# Patient Record
Sex: Female | Born: 1985 | Race: White | Hispanic: No | Marital: Single | Smoking: Former smoker
Health system: Southern US, Community
[De-identification: ages and names within clinical notes are randomized; demographics above are authoritative.]

## PROBLEM LIST (undated history)

## (undated) DIAGNOSIS — Z22322 Carrier or suspected carrier of Methicillin resistant Staphylococcus aureus: Secondary | ICD-10-CM

---

## 2017-09-23 ENCOUNTER — Encounter: Payer: Self-pay | Admitting: Emergency Medicine

## 2017-09-23 ENCOUNTER — Emergency Department: Payer: Self-pay

## 2017-09-23 ENCOUNTER — Other Ambulatory Visit: Payer: Self-pay

## 2017-09-23 ENCOUNTER — Emergency Department
Admission: EM | Admit: 2017-09-23 | Discharge: 2017-09-23 | Disposition: A | Discharge status: Home / Self Care | Payer: Self-pay | Attending: Emergency Medicine | Admitting: Emergency Medicine

## 2017-09-23 DIAGNOSIS — Z87891 Personal history of nicotine dependence: Secondary | ICD-10-CM | POA: Insufficient documentation

## 2017-09-23 DIAGNOSIS — J9801 Acute bronchospasm: Secondary | ICD-10-CM | POA: Insufficient documentation

## 2017-09-23 DIAGNOSIS — R0602 Shortness of breath: Secondary | ICD-10-CM | POA: Insufficient documentation

## 2017-09-23 HISTORY — DX: Carrier or suspected carrier of methicillin resistant Staphylococcus aureus: Z22.322

## 2017-09-23 LAB — TROPONIN I
Troponin I: <0.03 ng/mL (ref <0.03)
Troponin I: <0.03 ng/mL (ref <0.03)

## 2017-09-23 LAB — BASIC METABOLIC PANEL
Anion gap: 8 (ref 5–15)
BUN: 11 mg/dL (ref 6–20)
CO2: 21 mmol/L — AB (ref 22–32)
CREATININE: 0.6 mg/dL (ref 0.44–1.00)
Calcium: 8.7 mg/dL — ABNORMAL LOW (ref 8.9–10.3)
Chloride: 107 mmol/L (ref 101–111)
GFR calc Af Amer: >60 mL/min (ref >60)
GFR calc non Af Amer: >60 mL/min (ref >60)
Glucose, Bld: 139 mg/dL — ABNORMAL HIGH (ref 65–99)
Potassium: 4 mmol/L (ref 3.5–5.1)
Sodium: 136 mmol/L (ref 135–145)

## 2017-09-23 LAB — CBC
HCT: 40.1 % (ref 35.0–47.0)
Hemoglobin: 14 g/dL (ref 12.0–16.0)
MCH: 30.5 pg (ref 26.0–34.0)
MCHC: 34.8 g/dL (ref 32.0–36.0)
MCV: 87.5 fL (ref 80.0–100.0)
PLATELETS: 283 10*3/uL (ref 150–440)
RBC: 4.58 MIL/uL (ref 3.80–5.20)
RDW: 12.8 % (ref 11.5–14.5)
WBC: 7.9 10*3/uL (ref 3.6–11.0)

## 2017-09-23 LAB — POCT PREGNANCY, URINE: PREG TEST UR: NEGATIVE

## 2017-09-23 LAB — FIBRIN DERIVATIVES D-DIMER (ARMC ONLY): Fibrin derivatives D-dimer (ARMC): 160.41 ng/mL (FEU) (ref 0.00–499.00)

## 2017-09-23 MED ORDER — ALBUTEROL SULFATE (2.5 MG/3ML) 0.083% IN NEBU
5.0000 mg | INHALATION_SOLUTION | Freq: Once | RESPIRATORY_TRACT | Status: AC
  Administered 2017-09-23: 5 mg via RESPIRATORY_TRACT
  Filled 2017-09-23: qty 6

## 2017-09-23 MED ORDER — ALBUTEROL SULFATE HFA 108 (90 BASE) MCG/ACT IN AERS: 2.0000 | INHALATION_SPRAY | Freq: Four times a day (QID) | RESPIRATORY_TRACT | 2 refills | Status: AC | PRN

## 2017-09-23 MED ORDER — ASPIRIN 81 MG PO CHEW
324.0000 mg | CHEWABLE_TABLET | Freq: Once | ORAL | Status: AC
  Administered 2017-09-23: 324 mg via ORAL
  Filled 2017-09-23: qty 4

## 2017-09-23 NOTE — ED Triage Notes (Signed)
Pt reports she felt like her "heart was hurting" during the night and having shortness of breath.  Pt states the shortness of breath has been off and on over a month. Pt states she stopped smoking 1 month ago and vaping instead.  Pt states the shortness of breath is all the time no matter if she is exerting herself or not.  Pt able to speak in long sentences without running out of breath.

## 2017-09-23 NOTE — Care Management Note (Signed)
Case Management Note  Patient Details  Name: Karen Cross MRN: [id number] Date of Birth: 01/27/1986  Subjective/Objective:         RNCM consulted since patient is uninsured. Faxed scripts to medication management. [facility]           Action/Plan:   Expected Discharge Date:                  Expected Discharge Plan:     In-House Referral:     Discharge planning Services  CM Consult, [facility]igent [facility], Medication Assistance  Post Acute Care Choice:    Choice offered to:     DME Arranged:    DME Agency:     HH Arranged:    HH Agency:     Status of Service:  Completed, signed off  If discussed at [employer] Meetings, dates discussed:    Additional Comments:  Virgel ManifoldJosh A Audrielle Vankuren, RN 09/23/2017, 11:17 AM

## 2017-09-23 NOTE — ED Provider Notes (Signed)
[facility] Emergency Department Provider Note  ____________________________________________  Time seen: Approximately 9:10 AM  I have reviewed the triage vital signs and the nursing notes.   HISTORY  Chief Complaint Chest Pain and Shortness of Breath   HPI Karen Cross is a 32 y.o. female no significant past medical history who presents for evaluation of chest pain.  Patient reports that she was sitting down watching TV yesterday evening when she developed sudden onset of left-sided chest pressure initially associated with nausea.  She took Tylenol and went to bed.  This morning when she woke up the pain persisted.  She took 2 Tylenols with some improvement of her pain.  The pain is currently 5 out of 10.  The pain does not radiate and is not pleuritic in nature.  Patient denies ever having similar pain.  She reports that the pain is not worse with exertion.  She does report shortness of breath however this is an ongoing issue for several months.  That has improved after patient quit smoking a month ago.  She does not have a history of COPD or asthma.  She has a strong family history of cardiac disease in her father and grandfather.  No personal or family history of blood clots, no recent travel immobilization, no leg pain or swelling, no hemoptysis.  Patient does have a Mirena IUD.  No URI symptoms.  No abdominal pain.  No fever or chills.    Past Medical History:  Diagnosis Date  . MRSA (methicillin resistant Staphylococcus aureus) carrier      Past Surgical History:  Procedure Laterality Date  . CESAREAN SECTION  2011    Prior to Admission medications   Medication Sig Start Date End Date Taking? Authorizing Provider  acetaminophen (TYLENOL) 500 MG tablet Take 1,000 mg by mouth daily as needed for mild pain or moderate pain.   Yes [provider]  ibuprofen (ADVIL,MOTRIN) 200 MG tablet Take 800 mg by mouth 2 (two) times daily as needed for mild  pain or moderate pain.   Yes [provider]  albuterol (PROVENTIL HFA;VENTOLIN HFA) 108 (90 Base) MCG/ACT inhaler Inhale 2 puffs into the lungs every 6 (six) hours as needed for wheezing or shortness of breath. 09/23/17   Nita SickleVeronese, [place], MD    Allergies Clindamycin/lincomycin  No family history on file.  Social History Social History   Tobacco Use  . Smoking status: Former Smoker    Packs/day: 1.00    Years: 15.00    Pack years: 15.00    Types: Cigarettes  . Smokeless tobacco: Never Used  Substance Use Topics  . Alcohol use: Never    Frequency: Never  . Drug use: Never    Review of Systems  Constitutional: Negative for fever. Eyes: Negative for visual changes. ENT: Negative for sore throat. Neck: No neck pain  Cardiovascular: + chest pain. Respiratory: + shortness of breath. Gastrointestinal: Negative for abdominal pain, vomiting or diarrhea. Genitourinary: Negative for dysuria. Musculoskeletal: Negative for back pain. Skin: Negative for rash. Neurological: Negative for headaches, weakness or numbness. Psych: No SI or HI  ____________________________________________   PHYSICAL EXAM:  VITAL SIGNS: ED Triage Vitals  Enc Vitals Group     BP 09/23/17 0742 130/78     Pulse Rate 09/23/17 0742 82     Resp 09/23/17 0742 16     Temp 09/23/17 0742 99.2 F (37.3 C)     Temp Source 09/23/17 0742 Oral     SpO2 09/23/17 0742  98 %     Weight 09/23/17 0743 220 lb (99.8 kg)     Height 09/23/17 0743 5\' 5"  (1.651 m)     Head Circumference --      Peak Flow --      Pain Score 09/23/17 0742 4     Pain Loc --      Pain Edu? --      Excl. in GC? --     Constitutional: Alert and oriented. Well appearing and in no apparent distress. HEENT:      Head: Normocephalic and atraumatic.         Eyes: Conjunctivae are normal. Sclera is non-icteric.       Mouth/Throat: Mucous membranes are moist.       Neck: Supple with no signs of meningismus. Cardiovascular:  Regular rate and rhythm. No murmurs, gallops, or rubs. 2+ symmetrical distal pulses are present in all extremities. No JVD. Respiratory: Normal respiratory effort. Lungs are clear to auscultation bilaterally. No wheezes, crackles, or rhonchi.  Gastrointestinal: Soft, non tender, and non distended with positive bowel sounds. No rebound or guarding. Musculoskeletal: Nontender with normal range of motion in all extremities. No edema, cyanosis, or erythema of extremities. Neurologic: Normal speech and language. Face is symmetric. Moving all extremities. No gross focal neurologic deficits are appreciated. Skin: Skin is warm, dry and intact. No rash noted. Psychiatric: Mood and affect are normal. Speech and behavior are normal.  ____________________________________________   LABS (all labs ordered are listed, but only abnormal results are displayed)  Labs Reviewed  BASIC METABOLIC PANEL - Abnormal; Notable for the following components:      Result Value   CO2 21 (*)    Glucose, Bld 139 (*)    Calcium 8.7 (*)    All other components within normal limits  CBC  TROPONIN I  FIBRIN DERIVATIVES D-DIMER ([facility] ONLY)  TROPONIN I  POCT PREGNANCY, URINE  POC URINE PREG, ED   ____________________________________________  EKG  ED ECG REPORT I, Nita Sickle, the attending physician, personally viewed and interpreted this ECG.  Normal sinus rhythm, rate of 84, normal intervals, normal axis, no ST elevations or depressions. ____________________________________________  RADIOLOGY  I have personally reviewed the images performed during this visit and I agree with the Radiologist's read.   Interpretation by Radiologist:  Dg Chest 2 View  Result Date: 09/23/2017 CLINICAL DATA:  Chest pain and shortness of breath. EXAM: CHEST - 2 VIEW COMPARISON:  none FINDINGS: The heart size and mediastinal contours are within normal limits. Both lungs are clear. The visualized skeletal structures are  unremarkable. IMPRESSION: No active cardiopulmonary disease. Electronically Signed   By: Signa Kell M.D.   On: 09/23/2017 08:29     ____________________________________________   PROCEDURES  Procedure(s) performed: None Procedures Critical Care performed:  None ____________________________________________   INITIAL IMPRESSION / ASSESSMENT AND PLAN / ED COURSE   32 y.o. female no significant past medical history who presents for evaluation of chest pain.  Low suspicion for ACS with pain continuous for more than 12 hours, normal EKG and negative first troponin.  Patient does have a former history of smoking and strong family history of ischemic heart disease therefore we will get a second troponin and given aspirin.  Chest x-ray shows no evidence of pneumonia or pneumothorax.  The pain is not reproducible on palpation.  Patient does have a Mirena IUD making her PERC positive therefore will check a d-dimer to rule out PE.  Patient has no asymmetric leg  swelling or pain with no evidence of DVT clinically.    _________________________ 10:57 AM on 09/23/2017 -----------------------------------------  Labs with no acute findings, troponin x2-, d-dimer negative.  Patient reports resolution of the chest pressure after albuterol consistent with most likely bronchospasm.  Patient will be given a prescription for albuterol inhaler.  Recommend follow-up with primary care doctor.  Discussed return precautions.   As part of my medical decision making, I reviewed the following data within the electronic MEDICAL RECORD NUMBER Nursing notes reviewed and incorporated, Labs reviewed , EKG interpreted , Old chart reviewed, Radiograph reviewed , Notes from prior ED visits and [place] Controlled Substance Database    Pertinent labs & imaging results that were available during my care of the patient were reviewed by me and considered in my medical decision making (see chart for  details).    ____________________________________________   FINAL CLINICAL IMPRESSION(S) / ED DIAGNOSES  Final diagnoses:  Bronchospasm      NEW MEDICATIONS STARTED DURING THIS VISIT:  ED Discharge Orders        Ordered    albuterol (PROVENTIL HFA;VENTOLIN HFA) 108 (90 Base) MCG/ACT inhaler  Every 6 hours PRN     09/23/17 1056       Note:  This document was prepared using Dragon voice recognition software and may include unintentional dictation errors.    Don Perking, [place], MD 09/23/17 1058

## 2017-11-11 ENCOUNTER — Other Ambulatory Visit: Payer: Self-pay

## 2017-11-11 ENCOUNTER — Emergency Department: Payer: Self-pay

## 2017-11-11 ENCOUNTER — Encounter: Payer: Self-pay | Admitting: Emergency Medicine

## 2017-11-11 ENCOUNTER — Emergency Department
Admission: EM | Admit: 2017-11-11 | Discharge: 2017-11-12 | Disposition: A | Discharge status: Home / Self Care | Payer: Medicaid Other | Attending: Emergency Medicine | Admitting: Emergency Medicine

## 2017-11-11 DIAGNOSIS — J039 Acute tonsillitis, unspecified: Secondary | ICD-10-CM | POA: Insufficient documentation

## 2017-11-11 DIAGNOSIS — Z87891 Personal history of nicotine dependence: Secondary | ICD-10-CM | POA: Insufficient documentation

## 2017-11-11 LAB — COMPREHENSIVE METABOLIC PANEL
ALBUMIN: 4.5 g/dL (ref 3.5–5.0)
ALT: 26 U/L (ref 0–44)
AST: 20 U/L (ref 15–41)
Alkaline Phosphatase: 60 U/L (ref 38–126)
Anion gap: 7 (ref 5–15)
BUN: 12 mg/dL (ref 6–20)
CO2: 24 mmol/L (ref 22–32)
Calcium: 9 mg/dL (ref 8.9–10.3)
Chloride: 107 mmol/L (ref 98–111)
Creatinine, Ser: 0.48 mg/dL (ref 0.44–1.00)
GFR calc Af Amer: >60 mL/min (ref >60)
GFR calc non Af Amer: >60 mL/min (ref >60)
GLUCOSE: 93 mg/dL (ref 70–99)
Potassium: 3.6 mmol/L (ref 3.5–5.1)
SODIUM: 138 mmol/L (ref 135–145)
Total Bilirubin: 0.6 mg/dL (ref 0.3–1.2)
Total Protein: 7.8 g/dL (ref 6.5–8.1)

## 2017-11-11 LAB — CBC WITH DIFFERENTIAL/PLATELET
BASOS ABS: 0.1 10*3/uL (ref 0–0.1)
Basophils Relative: 1 %
Eosinophils Absolute: 0.2 10*3/uL (ref 0–0.7)
Eosinophils Relative: 2 %
HEMATOCRIT: 39.6 % (ref 35.0–47.0)
Hemoglobin: 13.8 g/dL (ref 12.0–16.0)
LYMPHS PCT: 31 %
Lymphs Abs: 4 10*3/uL — ABNORMAL HIGH (ref 1.0–3.6)
MCH: 30.3 pg (ref 26.0–34.0)
MCHC: 34.7 g/dL (ref 32.0–36.0)
MCV: 87.3 fL (ref 80.0–100.0)
Monocytes Absolute: 1 10*3/uL — ABNORMAL HIGH (ref 0.2–0.9)
Monocytes Relative: 8 %
Neutro Abs: 7.6 10*3/uL — ABNORMAL HIGH (ref 1.4–6.5)
Neutrophils Relative %: 58 %
Platelets: 305 10*3/uL (ref 150–440)
RBC: 4.53 MIL/uL (ref 3.80–5.20)
RDW: 12.9 % (ref 11.5–14.5)
WBC: 12.9 10*3/uL — ABNORMAL HIGH (ref 3.6–11.0)

## 2017-11-11 LAB — MONONUCLEOSIS SCREEN: Mono Screen: NEGATIVE

## 2017-11-11 LAB — GROUP A STREP BY PCR: GROUP A STREP BY PCR: NOT DETECTED

## 2017-11-11 MED ORDER — PIPERACILLIN-TAZOBACTAM 3.375 G IVPB 30 MIN
3.3750 g | Freq: Once | INTRAVENOUS | Status: AC
  Administered 2017-11-11: 3.375 g via INTRAVENOUS
  Filled 2017-11-11: qty 50

## 2017-11-11 MED ORDER — LIDOCAINE VISCOUS HCL 2 % MT SOLN: 10.0000 mL | OROMUCOSAL | 0 refills | Status: DC | PRN

## 2017-11-11 MED ORDER — CEFTRIAXONE SODIUM 1 G IJ SOLR
1.0000 g | Freq: Once | INTRAMUSCULAR | Status: AC
  Administered 2017-11-11: 1 g via INTRAVENOUS
  Filled 2017-11-11: qty 10

## 2017-11-11 MED ORDER — PREDNISONE 10 MG PO TABS: 10.0000 mg | ORAL_TABLET | Freq: Every day | ORAL | 0 refills | Status: AC

## 2017-11-11 MED ORDER — AMOXICILLIN-POT CLAVULANATE 875-125 MG PO TABS: 1.0000 | ORAL_TABLET | Freq: Two times a day (BID) | ORAL | 0 refills | Status: DC

## 2017-11-11 MED ORDER — IOHEXOL 300 MG/ML  SOLN
75.0000 mL | Freq: Once | INTRAMUSCULAR | Status: AC | PRN
  Administered 2017-11-11: 75 mL via INTRAVENOUS
  Filled 2017-11-11: qty 75

## 2017-11-11 MED ORDER — LIDOCAINE VISCOUS HCL 2 % MT SOLN: 10.0000 mL | OROMUCOSAL | 0 refills | Status: AC | PRN

## 2017-11-11 MED ORDER — AMOXICILLIN 875 MG PO TABS: 875.0000 mg | ORAL_TABLET | Freq: Two times a day (BID) | ORAL | 0 refills | Status: DC

## 2017-11-11 MED ORDER — PREDNISONE 10 MG PO TABS: 10.0000 mg | ORAL_TABLET | Freq: Every day | ORAL | 0 refills | Status: DC

## 2017-11-11 MED ORDER — DEXAMETHASONE SODIUM PHOSPHATE 10 MG/ML IJ SOLN
10.0000 mg | Freq: Once | INTRAMUSCULAR | Status: AC
  Administered 2017-11-11: 10 mg via INTRAVENOUS
  Filled 2017-11-11: qty 1

## 2017-11-11 NOTE — ED Provider Notes (Signed)
[facility] Emergency Department Provider Note  ____________________________________________  Time seen: Approximately 5:43 PM  I have reviewed the triage vital signs and the nursing notes.   HISTORY  Chief Complaint Sore Throat    HPI Karen Cross is a 32 y.o. female who presents the emergency department complaining of sore throat, irritation while swallowing.  Patient reports that has had symptoms for the past week.  Patient has a history of recurrent strep infections but denies symptoms consistent with strep.  She has had some mild fatigue but not significant fatigue.  No hematic emesis.  No recurrent emesis.  Patient denies any chest pressure, chest pain, burning sensation in her chest.  Patient denies any frank difficulty swallowing.  She is able to control her own secretions.  No recent URI symptoms, viral GI symptoms.  Patient has not tried any medications for this complaint.  No other complaints at this time.    Past Medical History:  Diagnosis Date  . MRSA (methicillin resistant Staphylococcus aureus) carrier     There are no active problems to display for this patient.   Past Surgical History:  Procedure Laterality Date  . CESAREAN SECTION  2011    Prior to Admission medications   Medication Sig Start Date End Date Taking? Authorizing Provider  acetaminophen (TYLENOL) 500 MG tablet Take 1,000 mg by mouth daily as needed for mild pain or moderate pain.    [provider]  albuterol (PROVENTIL HFA;VENTOLIN HFA) 108 (90 Base) MCG/ACT inhaler Inhale 2 puffs into the lungs every 6 (six) hours as needed for wheezing or shortness of breath. 09/23/17   Nita SickleVeronese, [place], MD  amoxicillin (AMOXIL) 875 MG tablet Take 1 tablet (875 mg total) by mouth 2 (two) times daily. 11/11/17   Aubree Doody, Delorise RoyalsJonathan D, PA-C  amoxicillin-clavulanate (AUGMENTIN) 875-125 MG tablet Take 1 tablet by mouth 2 (two) times daily. 11/11/17   Jayley Hustead, Delorise RoyalsJonathan D, PA-C   ibuprofen (ADVIL,MOTRIN) 200 MG tablet Take 800 mg by mouth 2 (two) times daily as needed for mild pain or moderate pain.    [provider]  lidocaine (XYLOCAINE) 2 % solution Use as directed 10 mLs in the mouth or throat every 4 (four) hours as needed for mouth pain. Swish, gargle, and spit 11/11/17   Carmilla Granville, Delorise RoyalsJonathan D, PA-C  predniSONE (DELTASONE) 10 MG tablet Take 1 tablet (10 mg total) by mouth daily. 11/11/17   Jennavecia Schwier, Delorise RoyalsJonathan D, PA-C    Allergies Clindamycin/lincomycin  History reviewed. No pertinent family history.  Social History Social History   Tobacco Use  . Smoking status: Former Smoker    Packs/day: 1.00    Years: 15.00    Pack years: 15.00    Types: Cigarettes  . Smokeless tobacco: Never Used  Substance Use Topics  . Alcohol use: Never    Frequency: Never  . Drug use: Never     Review of Systems  Constitutional: No fever/chills Eyes: No visual changes. No discharge ENT: Sore throat, irritation with swallowing Cardiovascular: no chest pain. Respiratory: no cough. No SOB. Gastrointestinal: No abdominal pain.  No nausea, no vomiting.  No diarrhea.  No constipation. Musculoskeletal: Negative for musculoskeletal pain. Skin: Negative for rash, abrasions, lacerations, ecchymosis. Neurological: Negative for headaches, focal weakness or numbness. 10-point ROS otherwise negative.  ____________________________________________   PHYSICAL EXAM:  VITAL SIGNS: ED Triage Vitals  Enc Vitals Group     BP 11/11/17 1635 137/74     Pulse Rate 11/11/17 1635 (!) 102     Resp --  Temp 11/11/17 1635 98.5 F (36.9 C)     Temp Source 11/11/17 1635 Oral     SpO2 11/11/17 1635 96 %     Weight 11/11/17 1635 220 lb (99.8 kg)     Height 11/11/17 1635 5\' 4"  (1.626 m)     Head Circumference --      Peak Flow --      Pain Score 11/11/17 1643 7     Pain Loc --      Pain Edu? --      Excl. in GC? --      Constitutional: Alert and oriented. Well  appearing and in no acute distress. Eyes: Conjunctivae are normal. PERRL. EOMI. Head: Atraumatic. ENT:      Ears:       Nose: No congestion/rhinnorhea.      Mouth/Throat: Mucous membranes are moist.  Tonsils are edematous bilaterally but no exudates, no erythema.  Uvula is midline.  No visible foreign body.  No posterior pharynx erosions appreciated.  No lingular tonsilar hypertrophy. Neck: No stridor.  Neck is supple full range of motion Hematological/Lymphatic/Immunilogical: No cervical lymphadenopathy. Cardiovascular: Normal rate, regular rhythm. Normal S1 and S2.  Good peripheral circulation. Respiratory: Normal respiratory effort without tachypnea or retractions. Lungs CTAB. Good air entry to the bases with no decreased or absent breath sounds. Gastrointestinal: Bowel sounds 4 quadrants. Soft and nontender to palpation. No guarding or rigidity. No palpable masses. No distention. No CVA tenderness. Musculoskeletal: Full range of motion to all extremities. No gross deformities appreciated. Neurologic:  Normal speech and language. No gross focal neurologic deficits are appreciated.  Skin:  Skin is warm, dry and intact. No rash noted. Psychiatric: Mood and affect are normal. Speech and behavior are normal. Patient exhibits appropriate insight and judgement.   ____________________________________________   LABS (all labs ordered are listed, but only abnormal results are displayed)  Labs Reviewed  CBC WITH DIFFERENTIAL/PLATELET - Abnormal; Notable for the following components:      Result Value   WBC 12.9 (*)    Neutro Abs 7.6 (*)    Lymphs Abs 4.0 (*)    Monocytes Absolute 1.0 (*)    All other components within normal limits  GROUP A STREP BY PCR  MONONUCLEOSIS SCREEN  COMPREHENSIVE METABOLIC PANEL   ____________________________________________  EKG   ____________________________________________  RADIOLOGY I personally viewed and evaluated these images as part of my  medical decision making, as well as reviewing the written report by the radiologist.  Dg Neck Soft Tissue  Result Date: 11/11/2017 CLINICAL DATA:  Sore throat for 1 week. EXAM: NECK SOFT TISSUES - 1+ VIEW COMPARISON:  None. FINDINGS: The cervical spine is normal in appearance. Prevertebral soft tissues are normal. Apparent soft tissue thickening at the base of tongue. The vallecular is not well aerated. It is difficult to differentiate the epiglottis from the base of tongue due to vallecula effacement. No foreign body noted. No other acute abnormality. IMPRESSION: There appears to be a swelling at the base of the tongue with effacement of the vallecula. It is difficult to differentiate the epiglottis from the base of tongue due to effacement of the vallecula. However, within visualize limits, the epiglottis is not definitely enlarged. Electronically Signed   By: Gerome Sam III M.D   On: 11/11/2017 19:02   Ct Soft Tissue Neck W Contrast  Result Date: 11/11/2017 CLINICAL DATA:  Sore throat and stridor. EXAM: CT NECK WITH CONTRAST TECHNIQUE: Multidetector CT imaging of the neck was performed using the  standard protocol following the bolus administration of intravenous contrast. CONTRAST:  75mL OMNIPAQUE IOHEXOL 300 MG/ML  SOLN COMPARISON:  None. FINDINGS: PHARYNX AND LARYNX: --Nasopharynx: Mildly enlarged adenoid tonsils. --Oral cavity and oropharynx: Mildly enlarged palatine and markedly enlarged lingual tonsils. No peritonsillar fluid collection. --Hypopharynx: Normal vallecula and pyriform sinuses. --Larynx: Normal epiglottis and pre-epiglottic space. Normal aryepiglottic and vocal folds. --Retropharyngeal space: No abscess, effusion or lymphadenopathy. SALIVARY GLANDS: --Parotid: No mass lesion or inflammation. No sialolithiasis or ductal dilatation. --Submandibular: Symmetric without inflammation. No sialolithiasis or ductal dilatation. --Sublingual: Normal. No ranula or other visible lesion of the  base of tongue and floor of mouth. THYROID: Normal. LYMPH NODES: No enlarged or abnormal density lymph nodes. VASCULAR: Major cervical vessels are patent. LIMITED INTRACRANIAL: Normal. VISUALIZED ORBITS: Normal. MASTOIDS AND VISUALIZED PARANASAL SINUSES: No fluid levels or advanced mucosal thickening. No mastoid effusion. SKELETON: No bony spinal canal stenosis. No lytic or blastic lesions. UPPER CHEST: Clear. OTHER: None. IMPRESSION: Findings of acute tonsillopharyngitis, predominantly involving the lingual tonsils. No peritonsillar abscess or fluid collection. Normal epiglottis. No airway compromise. Electronically Signed   By: Deatra Robinson M.D.   On: 11/11/2017 21:30    ____________________________________________    PROCEDURES  Procedure(s) performed:    Procedures    Medications  cefTRIAXone (ROCEPHIN) 1 g in sodium chloride 0.9 % 100 mL IVPB (1 g Intravenous New Bag/Given 11/11/17 2252)  piperacillin-tazobactam (ZOSYN) IVPB 3.375 g (has no administration in time range)  iohexol (OMNIPAQUE) 300 MG/ML solution 75 mL (75 mLs Intravenous Contrast Given 11/11/17 2104)  dexamethasone (DECADRON) injection 10 mg (10 mg Intravenous Given 11/11/17 2252)     ____________________________________________   INITIAL IMPRESSION / ASSESSMENT AND PLAN / ED COURSE  Pertinent labs & imaging results that were available during my care of the patient were reviewed by me and considered in my medical decision making (see chart for details).  Review of the [place] CSRS was performed in accordance of the [facility] prior to dispensing any controlled drugs.  Clinical Course as of Nov 11 2305  Tue Nov 11, 2017  1800 Patient presents the emergency department with 1 week history of sore throat, irritation with swallowing.  No difficulty swallowing or controlling secretions.  Differential includes strep, mono, esophagitis, Mallory-Weiss tear, achalasia, GERD, foreign body.  Patient will be evaluated with mono test,  strep test, x-ray of the neck.  At this time, most likely diagnosis is esophagitis from GERD.  If results are overall negative, patient will be placed on GI cocktail, PPI and H2 blocker.  Patient would then be referred to GI for further evaluation.   [JC]  2000 Patient's x-ray returned with fullness of the vallecula near the epiglottis.  Given x-ray findings, concern for mass versus abscess versus epiglottitis versus lingular tonsillitis exist.  Basic labs and CT scan will be ordered to further evaluate this area.   [JC]    Clinical Course User Index [JC] Fallon Howerter, Delorise Royals, PA-C     Patient's diagnosis is consistent with lingular tonsillitis.  Patient presents the emergency department with a sore throat, irritation with swallowing.  Patient's initial work-up revealed edema around the vallecula and epiglottis.  Given patient's symptoms, further investigation with CT scan and basic labs was obtained.  No epiglottitis but patient does have enlarged lingular tonsils consistent with tonsillitis.  Patient is given Zosyn and Rocephin in the emergency department and Decadron.. Patient will be discharged home with prescriptions for amoxicillin, Augmentin, steroid taper, viscous lidocaine. Patient is to follow up with  ENT as needed or otherwise directed. Patient is given ED precautions to return to the ED for any worsening or new symptoms.     ____________________________________________  FINAL CLINICAL IMPRESSION(S) / ED DIAGNOSES  Final diagnoses:  Lingular tonsillitis      NEW MEDICATIONS STARTED DURING THIS VISIT:  ED Discharge Orders        Ordered    amoxicillin (AMOXIL) 875 MG tablet  2 times daily,   Status:  Discontinued     11/11/17 2154    amoxicillin-clavulanate (AUGMENTIN) 875-125 MG tablet  2 times daily,   Status:  Discontinued     11/11/17 2154    predniSONE (DELTASONE) 10 MG tablet  Daily,   Status:  Discontinued    Note to Pharmacy:  Take 6 pills x 2 days, 5 pills x 2  days, 4 pills x 2 days, 3 pills x 2 days, 2 pills x 2 days, and 1 pill x 2 days   11/11/17 2154    lidocaine (XYLOCAINE) 2 % solution  Every 4 hours PRN,   Status:  Discontinued     11/11/17 2154    amoxicillin (AMOXIL) 875 MG tablet  2 times daily     11/11/17 2204    amoxicillin-clavulanate (AUGMENTIN) 875-125 MG tablet  2 times daily     11/11/17 2204    lidocaine (XYLOCAINE) 2 % solution  Every 4 hours PRN     11/11/17 2204    predniSONE (DELTASONE) 10 MG tablet  Daily    Note to Pharmacy:  Take 6 pills x 2 days, 5 pills x 2 days, 4 pills x 2 days, 3 pills x 2 days, 2 pills x 2 days, and 1 pill x 2 days   11/11/17 2204          This chart was dictated using voice recognition software/Dragon. Despite best efforts to proofread, errors can occur which can change the meaning. Any change was purely unintentional.    Racheal Patches, PA-C 11/11/17 2307    Dionne Bucy, MD 11/11/17 [id number]

## 2017-11-11 NOTE — ED Triage Notes (Signed)
Pt presents to ED with c/o pain to her lower throat with eating or drinking. Pt states pain with swallowing, pt able to maintain her own secretions at this time. Pt with no difficulty breathing, no difficulty speaking at this time.

## 2017-11-11 NOTE — ED Notes (Signed)
ED Provider at bedside. 

## 2017-11-11 NOTE — ED Notes (Signed)
See triage note: Pain began in the back of her throat but has progressed further down towards laryngopharynx.

## 2017-11-12 ENCOUNTER — Other Ambulatory Visit: Payer: Self-pay

## 2017-11-12 ENCOUNTER — Emergency Department
Admission: EM | Admit: 2017-11-12 | Discharge: 2017-11-12 | Disposition: A | Discharge status: Home / Self Care | Payer: Medicaid Other | Attending: Student in an Organized Health Care Education/Training Program | Admitting: Student in an Organized Health Care Education/Training Program

## 2017-11-12 DIAGNOSIS — Z87891 Personal history of nicotine dependence: Secondary | ICD-10-CM | POA: Insufficient documentation

## 2017-11-12 DIAGNOSIS — T7840XA Allergy, unspecified, initial encounter: Secondary | ICD-10-CM

## 2017-11-12 MED ORDER — CEFDINIR 300 MG PO CAPS: 300.0000 mg | ORAL_CAPSULE | Freq: Two times a day (BID) | ORAL | 0 refills | Status: AC

## 2017-11-12 NOTE — ED Triage Notes (Signed)
Pt arrives to ED thinking she is having an allergic reaction to medication she started last night (antibiotics). Hasn't taken prednisone or lidocaine that she was prescribed. Alert, oriented, speaking in complete sentences. Red spots noted but don't appear to be hives. Itching per pt. No resp distress noted. Hasn't taken benadryl. Symptoms started about 1 hr PTA.

## 2017-11-12 NOTE — ED Provider Notes (Signed)
[facility] Emergency Department Provider Note  ____________________________________________   First MD Initiated Contact with Patient 11/12/17 1145     (approximate)  I have reviewed the triage vital signs and the nursing notes.   HISTORY  Chief Complaint Allergic Reaction    HPI Karen Cross is a 32 y.o. female presents emergency department because she thinks she is having allergic reaction to medication that she was given last night.  She states that she was fine this morning and then she took the 2 doses of amoxicillin and became red and flushed on her chest and had a lot of itching.  She denies chest pain or shortness of breath.  She denies any oral swelling.  She denies any hives.    Past Medical History:  Diagnosis Date  . MRSA (methicillin resistant Staphylococcus aureus) carrier     There are no active problems to display for this patient.   Past Surgical History:  Procedure Laterality Date  . CESAREAN SECTION  2011    Prior to Admission medications   Medication Sig Start Date End Date Taking? Authorizing Provider  acetaminophen (TYLENOL) 500 MG tablet Take 1,000 mg by mouth daily as needed for mild pain or moderate pain.    [provider]  albuterol (PROVENTIL HFA;VENTOLIN HFA) 108 (90 Base) MCG/ACT inhaler Inhale 2 puffs into the lungs every 6 (six) hours as needed for wheezing or shortness of breath. 09/23/17   Nita SickleVeronese, [place], MD  cefdinir (OMNICEF) 300 MG capsule Take 1 capsule (300 mg total) by mouth 2 (two) times daily. 11/12/17   Fisher, Roselyn BeringSusan W, PA-C  ibuprofen (ADVIL,MOTRIN) 200 MG tablet Take 800 mg by mouth 2 (two) times daily as needed for mild pain or moderate pain.    [provider]  lidocaine (XYLOCAINE) 2 % solution Use as directed 10 mLs in the mouth or throat every 4 (four) hours as needed for mouth pain. Swish, gargle, and spit 11/11/17   Cuthriell, Delorise RoyalsJonathan D, PA-C  predniSONE (DELTASONE) 10 MG  tablet Take 1 tablet (10 mg total) by mouth daily. 11/11/17   Cuthriell, Delorise RoyalsJonathan D, PA-C    Allergies Clindamycin/lincomycin  History reviewed. No pertinent family history.  Social History Social History   Tobacco Use  . Smoking status: Former Smoker    Packs/day: 1.00    Years: 15.00    Pack years: 15.00    Types: Cigarettes  . Smokeless tobacco: Never Used  Substance Use Topics  . Alcohol use: Never    Frequency: Never  . Drug use: Never    Review of Systems  Constitutional: No fever/chills Eyes: No visual changes. ENT: No sore throat. Respiratory: Denies cough Genitourinary: Negative for dysuria. Musculoskeletal: Negative for back pain. Skin: Negative for rash.  Positive for flushing and itching for taking a medication    ____________________________________________   PHYSICAL EXAM:  VITAL SIGNS: ED Triage Vitals  Enc Vitals Group     BP 11/12/17 1127 (!) 161/91     Pulse Rate 11/12/17 1127 100     Resp 11/12/17 1127 16     Temp 11/12/17 1127 98.7 F (37.1 C)     Temp Source 11/12/17 1127 Oral     SpO2 11/12/17 1127 98 %     Weight 11/12/17 1128 220 lb (99.8 kg)     Height 11/12/17 1128 5\' 4"  (1.626 m)     Head Circumference --      Peak Flow --      Pain Score 11/12/17  1128 0     Pain Loc --      Pain Edu? --      Excl. in GC? --     Constitutional: Alert and oriented. Well appearing and in no acute distress. Eyes: Conjunctivae are normal.  Head: Atraumatic. Nose: No congestion/rhinnorhea. Mouth/Throat: Mucous membranes are moist.  Throat shows mildly swollen tonsils. Neck:  supple no lymphadenopathy noted Cardiovascular: Normal rate, regular rhythm. Heart sounds are normal Respiratory: Normal respiratory effort.  No retractions, lungs c t a  Abd: soft nontender bs normal all 4 quad GU: deferred Musculoskeletal: FROM all extremities, warm and well perfused Neurologic:  Normal speech and language.  Skin:  Skin is warm, dry and intact. No  rash noted. Psychiatric: Mood and affect are normal. Speech and behavior are normal.  ____________________________________________   LABS (all labs ordered are listed, but only abnormal results are displayed)  Labs Reviewed - No data to display ____________________________________________   ____________________________________________  RADIOLOGY    ____________________________________________   PROCEDURES  Procedure(s) performed: No  Procedures    ____________________________________________   INITIAL IMPRESSION / ASSESSMENT AND PLAN / ED COURSE  Pertinent labs & imaging results that were available during my care of the patient were reviewed by me and considered in my medical decision making (see chart for details).   Patient is a 32 year old female presents emergency department complaining of a rash and itching after taking the double dose of amoxicillin that she was instructed to take.  She states she took 875 mg of Amoxil and 875 mg of Augmentin.  She states afterwards she began to itch and her chest was flushed.  She is concerned she had an allergic reaction.  Physical exam she appears well.  There is no oral swelling.  Lungs are clear to auscultation without wheezing.  The chest is flushed but there are no hives.  Discussed the findings with the patient.  Explained to her it could be because the dose of the amoxicillin was so large or she could still be flushing from the Decadron that she was given through the IV last night.  However due to her other allergies I switched her antibiotic from the Amoxil and Augmentin to [facility].  She is to stop taking the amoxicillin and Augmentin.  She states she understands will comply with instructions.  She is to take the steroid she was prescribed last night.  Return to the emergency department if worsening.  She states she understands will comply with our instructions.  She is discharged in stable condition     As part of my  medical decision making, I reviewed the following data within the electronic MEDICAL RECORD NUMBER Nursing notes reviewed and incorporated, Old chart reviewed, Notes from prior ED visits and [place] Controlled Substance Database  ____________________________________________   FINAL CLINICAL IMPRESSION(S) / ED DIAGNOSES  Final diagnoses:  Allergic reaction to drug, initial encounter      NEW MEDICATIONS STARTED DURING THIS VISIT:  Discharge Medication List as of 11/12/2017 12:40 PM    START taking these medications   Details  cefdinir (OMNICEF) 300 MG capsule Take 1 capsule (300 mg total) by mouth 2 (two) times daily., Starting Wed 11/12/2017, Normal         Note:  This document was prepared using Dragon voice recognition software and may include unintentional dictation errors.    Faythe Ghee, PA-C 11/12/17 1724    Willy Eddy, MD 11/14/17 [phone]

## 2017-11-12 NOTE — Discharge Instructions (Addendum)
Follow-up with your regular doctor or [facility] if not better in 3 to 5 days.  Return to the emergency department if worsening.  Take the prednisone as 6 pills one day, 5 pills the next day for, 3, 2 then 1

## 2019-11-23 IMAGING — CT CT NECK W/ CM
4 of 5 series · 14 of 33 positions shown, 16 images · IV contrast (omnipaque)
Comparison: None.

CLINICAL DATA: Sore throat and stridor.

EXAM:
CT NECK WITH CONTRAST
TECHNIQUE: Multidetector CT imaging of the neck was performed using the
standard protocol following the bolus administration of intravenous
contrast.
CONTRAST:  75mL OMNIPAQUE IOHEXOL 300 MG/ML  SOLN

[Series 2: axial neck · axial · 0.51mm/px · z∈[-316,-174]mm · 4 of 119 slices shown, 5 images]
[im 24/119  soft-tissue]
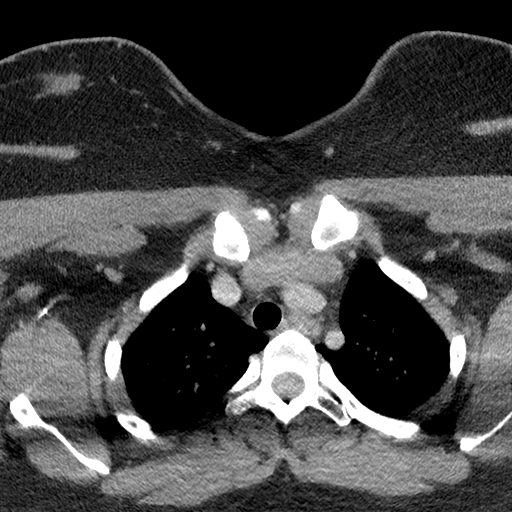
[im 24/119  bone]
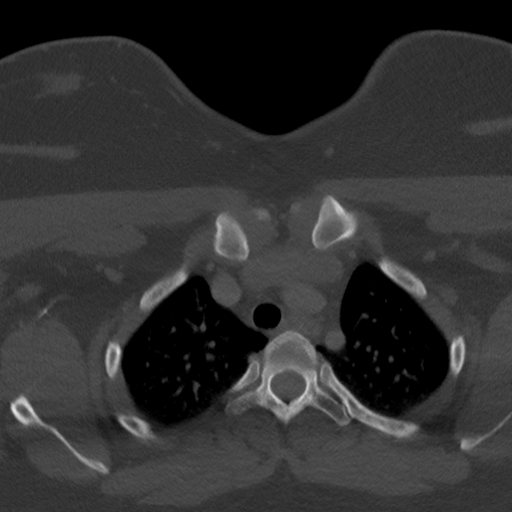
[im 48/119  bone]
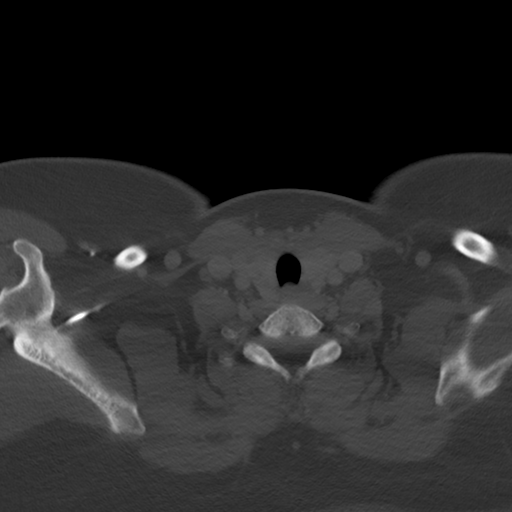
[im 71/119  bone]
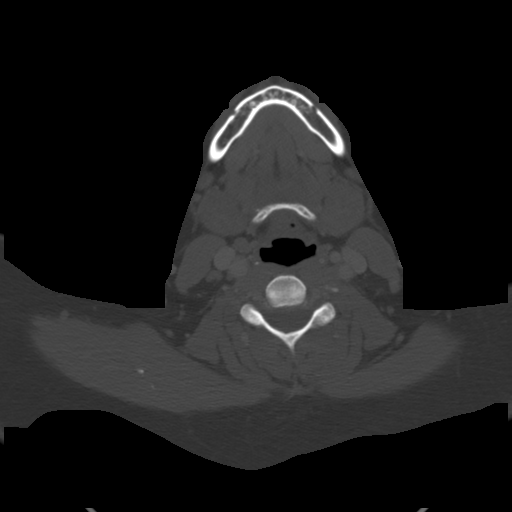
[im 95/119  bone]
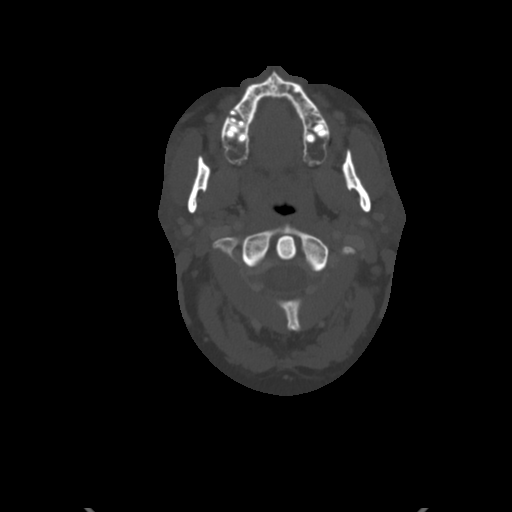

[Series 6: sag neck · sagittal · 0.42mm/px · 5 of 73 slices shown, 6 images]
[im 25/73  bone]
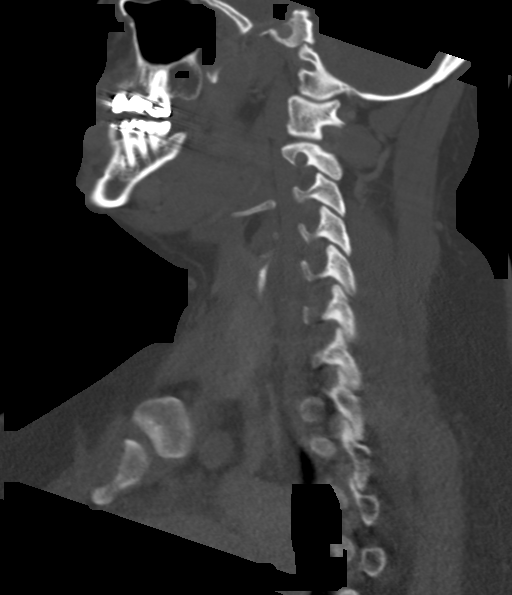
[im 31/73  bone]
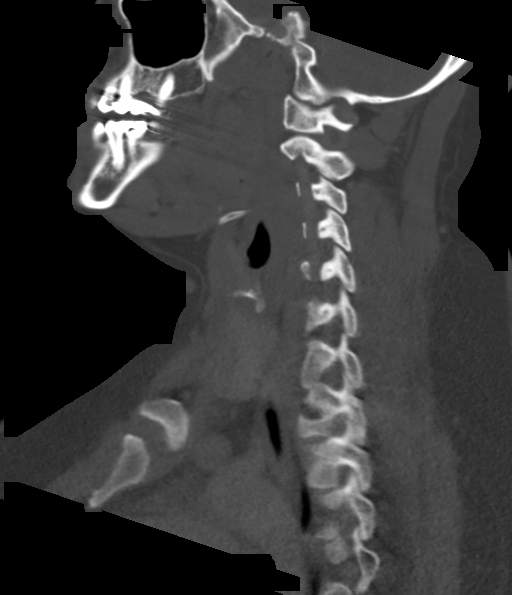
[im 37/73  soft-tissue]
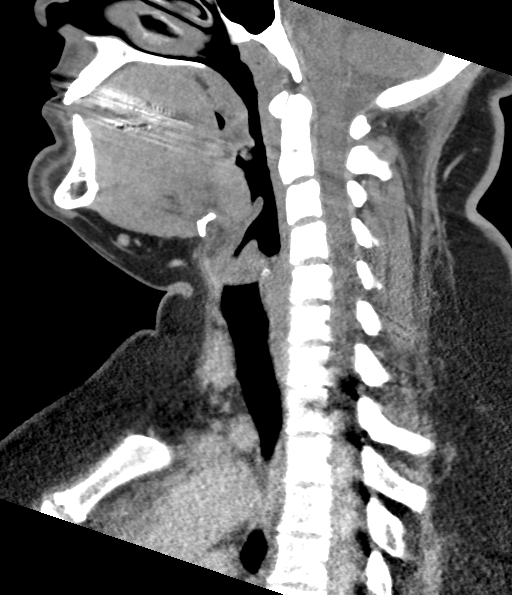
[im 37/73  bone]
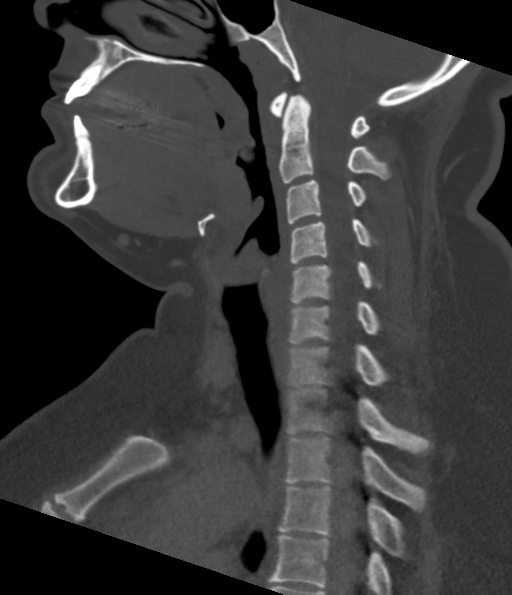
[im 43/73  bone]
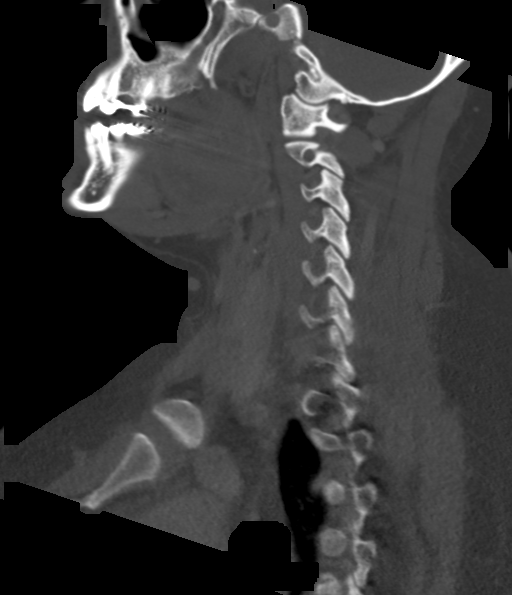
[im 49/73  bone]
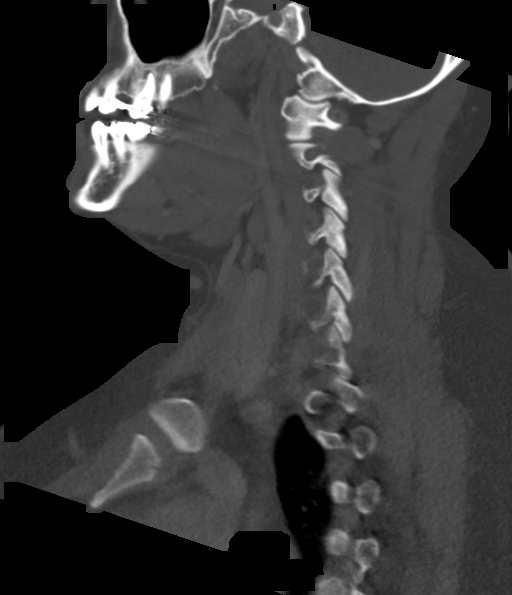

[Series 7: cor neck · coronal · 0.33mm/px · 3 of 105 slices shown]
[im 30/105  bone]
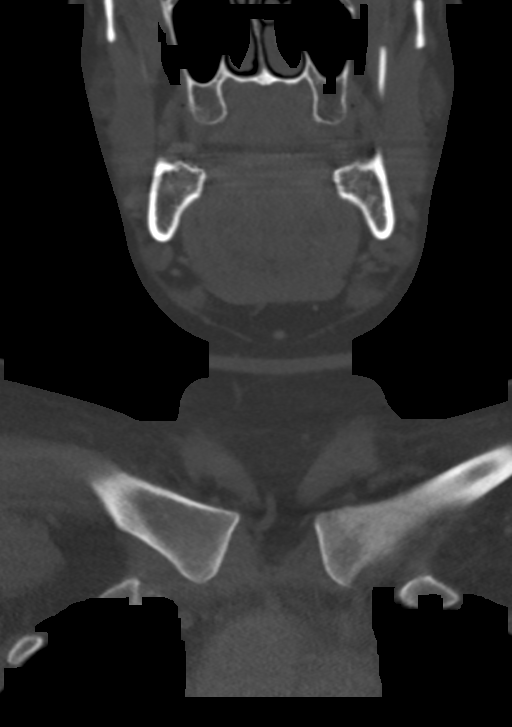
[im 45/105  bone]
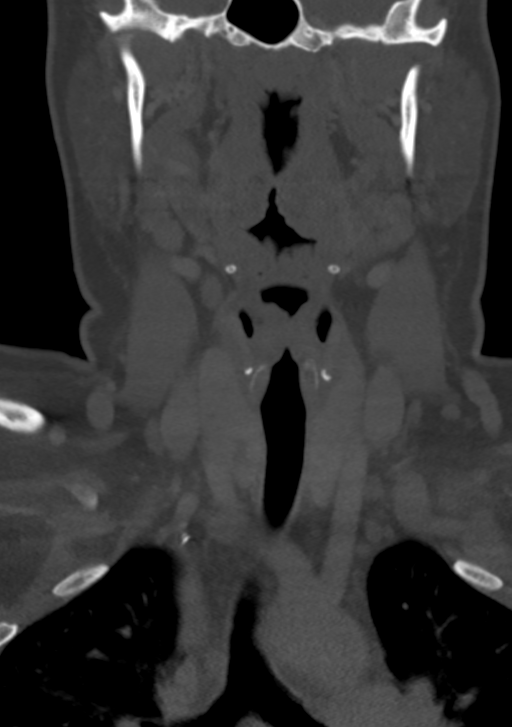
[im 60/105  bone]
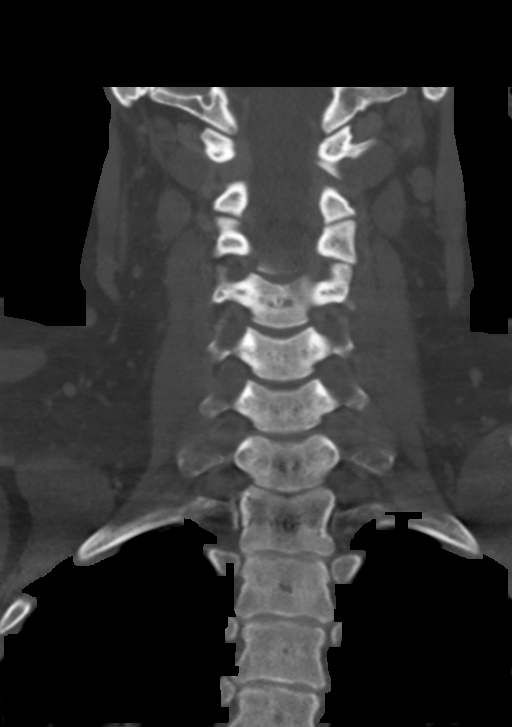

[Series 8: orthogonal ax · axial · 0.32mm/px · z∈[-351,-306]mm · 2 of 121 slices shown]
[im 25/121  bone]
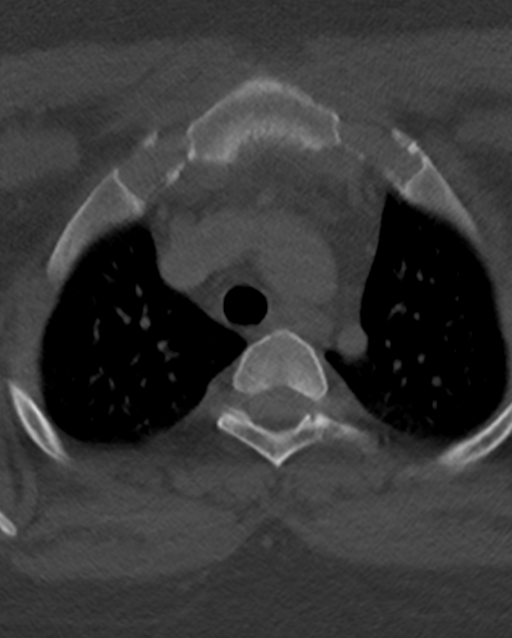
[im 49/121  bone]
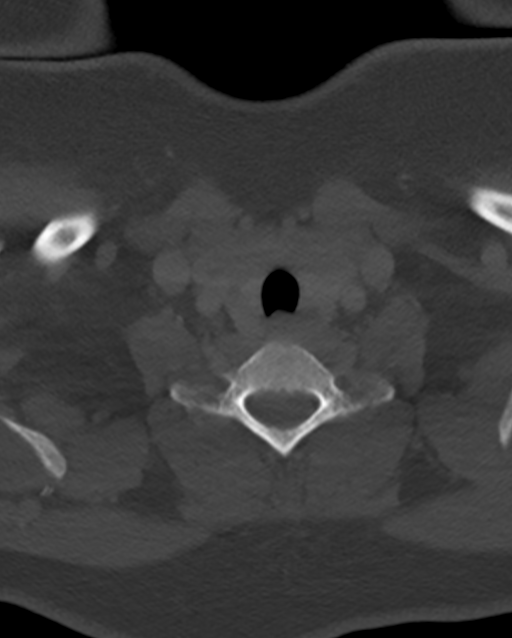

[14 of 33 positions shown; findings below may reference images not displayed]

FINDINGS: PHARYNX AND LARYNX:

--Nasopharynx: Mildly enlarged adenoid tonsils.

--Oral cavity and oropharynx: Mildly enlarged palatine and markedly
enlarged lingual tonsils. No peritonsillar fluid collection.

--Hypopharynx: Normal vallecula and pyriform sinuses.

--Larynx: Normal epiglottis and pre-epiglottic space. Normal
aryepiglottic and vocal folds.

--Retropharyngeal space: No abscess, effusion or lymphadenopathy.

SALIVARY GLANDS:

--Parotid: No mass lesion or inflammation. No sialolithiasis or
ductal dilatation.

--Submandibular: Symmetric without inflammation. No sialolithiasis
or ductal dilatation.

--Sublingual: Normal. No ranula or other visible lesion of the base
of tongue and floor of mouth.

THYROID: Normal.

LYMPH NODES: No enlarged or abnormal density lymph nodes.

VASCULAR: Major cervical vessels are patent.

LIMITED INTRACRANIAL: Normal.

VISUALIZED ORBITS: Normal.

MASTOIDS AND VISUALIZED PARANASAL SINUSES: No fluid levels or
advanced mucosal thickening. No mastoid effusion.

SKELETON: No bony spinal canal stenosis. No lytic or blastic
lesions.

UPPER CHEST: Clear.

OTHER: None.
IMPRESSION: Findings of acute tonsillopharyngitis, predominantly involving the
lingual tonsils. No peritonsillar abscess or fluid collection.
Normal epiglottis. No airway compromise.
